# Patient Record
Sex: Male | Born: 1970 | Race: White | Hispanic: No | Marital: Married | Smoking: Former smoker
Health system: Southern US, Community
[De-identification: ages and names within clinical notes are randomized; demographics above are authoritative.]

## PROBLEM LIST (undated history)

## (undated) DIAGNOSIS — K7290 Hepatic failure, unspecified without coma: Secondary | ICD-10-CM

## (undated) DIAGNOSIS — J45909 Unspecified asthma, uncomplicated: Secondary | ICD-10-CM

## (undated) DIAGNOSIS — I10 Essential (primary) hypertension: Secondary | ICD-10-CM

## (undated) HISTORY — DX: Hepatic failure, unspecified without coma: K72.90

## (undated) HISTORY — PX: COLON RESECTION: SHX5231

## (undated) HISTORY — PX: HERNIA REPAIR: SHX51

---

## 2008-09-23 ENCOUNTER — Ambulatory Visit: Payer: Self-pay | Admitting: Family Medicine

## 2008-09-23 DIAGNOSIS — S139XXA Sprain of joints and ligaments of unspecified parts of neck, initial encounter: Secondary | ICD-10-CM

## 2008-09-23 DIAGNOSIS — S20219A Contusion of unspecified front wall of thorax, initial encounter: Secondary | ICD-10-CM

## 2008-09-23 DIAGNOSIS — I10 Essential (primary) hypertension: Secondary | ICD-10-CM | POA: Insufficient documentation

## 2008-09-25 ENCOUNTER — Telehealth (INDEPENDENT_AMBULATORY_CARE_PROVIDER_SITE_OTHER): Payer: Self-pay | Admitting: *Deleted

## 2008-09-26 ENCOUNTER — Telehealth (INDEPENDENT_AMBULATORY_CARE_PROVIDER_SITE_OTHER): Payer: Self-pay | Admitting: *Deleted

## 2008-09-28 ENCOUNTER — Telehealth (INDEPENDENT_AMBULATORY_CARE_PROVIDER_SITE_OTHER): Payer: Self-pay | Admitting: *Deleted

## 2008-09-28 ENCOUNTER — Encounter (INDEPENDENT_AMBULATORY_CARE_PROVIDER_SITE_OTHER): Payer: Self-pay | Admitting: *Deleted

## 2008-10-05 ENCOUNTER — Telehealth (INDEPENDENT_AMBULATORY_CARE_PROVIDER_SITE_OTHER): Payer: Self-pay | Admitting: *Deleted

## 2009-07-04 ENCOUNTER — Emergency Department (HOSPITAL_BASED_OUTPATIENT_CLINIC_OR_DEPARTMENT_OTHER): Admission: EM | Admit: 2009-07-04 | Discharge: 2009-07-05 | Payer: Self-pay | Admitting: Emergency Medicine

## 2009-12-30 ENCOUNTER — Emergency Department (HOSPITAL_COMMUNITY): Admission: EM | Admit: 2009-12-30 | Discharge: 2009-12-31 | Payer: Self-pay | Admitting: Emergency Medicine

## 2010-01-03 ENCOUNTER — Emergency Department (HOSPITAL_COMMUNITY): Admission: EM | Admit: 2010-01-03 | Discharge: 2010-01-03 | Payer: Self-pay | Admitting: Emergency Medicine

## 2010-01-08 ENCOUNTER — Encounter: Payer: Self-pay | Admitting: Family Medicine

## 2010-05-15 NOTE — Letter (Signed)
Summary: [facility] [facility] [employer]  IN[facility] [facility] [employer]   Imported By: Dannette Barbara 01/08/2010 15:10:53  _____________________________________________________________________  External Attachment:    Type:   Image     Comment:   External Document

## 2010-06-28 LAB — URINALYSIS, ROUTINE W REFLEX MICROSCOPIC
Hgb urine dipstick: NEGATIVE
Ketones, ur: 15 mg/dL — AB
Nitrite: NEGATIVE
Protein, ur: NEGATIVE mg/dL
Specific Gravity, Urine: 1.013 (ref 1.005–1.030)
Urobilinogen, UA: 1 mg/dL (ref 0.0–1.0)

## 2010-06-28 LAB — POCT I-STAT, CHEM 8
BUN: 7 mg/dL (ref 6–23)
Creatinine, Ser: 1 mg/dL (ref 0.4–1.5)
Glucose, Bld: 98 mg/dL (ref 70–99)
Sodium: 137 mEq/L (ref 135–145)
TCO2: 34 mmol/L (ref 0–100)

## 2010-08-29 ENCOUNTER — Inpatient Hospital Stay (INDEPENDENT_AMBULATORY_CARE_PROVIDER_SITE_OTHER)
Admission: RE | Admit: 2010-08-29 | Discharge: 2010-08-29 | Disposition: A | Discharge status: Home / Self Care | Payer: 59 | Source: Ambulatory Visit | Attending: Emergency Medicine | Admitting: Emergency Medicine

## 2010-08-29 ENCOUNTER — Ambulatory Visit
Admission: RE | Admit: 2010-08-29 | Discharge: 2010-08-29 | Disposition: A | Discharge status: Home / Self Care | Payer: 59 | Source: Ambulatory Visit | Attending: Emergency Medicine | Admitting: Emergency Medicine

## 2010-08-29 ENCOUNTER — Encounter: Payer: Self-pay | Admitting: Emergency Medicine

## 2010-08-29 ENCOUNTER — Other Ambulatory Visit: Payer: Self-pay | Admitting: Emergency Medicine

## 2010-08-29 DIAGNOSIS — IMO0002 Reserved for concepts with insufficient information to code with codable children: Secondary | ICD-10-CM | POA: Insufficient documentation

## 2010-08-29 DIAGNOSIS — R0602 Shortness of breath: Secondary | ICD-10-CM

## 2010-08-29 DIAGNOSIS — R079 Chest pain, unspecified: Secondary | ICD-10-CM

## 2011-03-18 NOTE — Progress Notes (Signed)
Summary: BACK PAIN, CUTS, RIGHT EYE INJURY/TJ Room 4   Vital Signs:  Patient Profile:   40 Years Old Male CC:      Rt lung pain and congestion, rt eye pain, back,  rt arm,  from fall on Sunday Height:     70 inches Weight:      160 pounds O2 Sat:      93 % O2 treatment:    Room Air Temp:     99.5 degrees F oral Pulse rate:   117 / minute Pulse rhythm:   regular Resp:     16  per minute BP sitting:   133 / 91  (left arm) Cuff size:   regular  Vitals Entered By: Emilio Math (Aug 29, 2010 3:36 PM)                  Current Allergies (reviewed today): ! BIAXINHistory of Present Illness History from: patient Chief Complaint: Rt lung pain and congestion, rt eye pain, back,  rt arm,  from fall on Sunday History of Present Illness: Four days ago, he was in the pool and fell and hit his R eye.  It was bleeding but has begun to heal up.  Then he was moving into a new apartment and fell again, cutting his back.  He states that both cases were secondary to alcohol.  Since then he has had R sided chest pain, worse with coughing and deep breaths.  His PCP advised that he go to the ER, but instead he came here.  Over the past month he has had a chronic cough and a tickle in his throat.  No F/C/N/V.  He is a smoker.  Current Meds LEXAPRO 20 MG TABS (ESCITALOPRAM OXALATE) 1 qd AVALIDE 300-12.5 MG TABS (IRBESARTAN-HYDROCHLOROTHIAZIDE) 1 qd ACCUNEB 0.63 MG/3ML NEBU (ALBUTEROL SULFATE) prn IBUPROFEN 800 MG TABS (IBUPROFEN) One by mouth every 8 hours with food NEURONTIN 400 MG CAPS (GABAPENTIN)  POTASSIUM 99 MG TABS (POTASSIUM) 200 mg SYMBICORT 80-4.5 MCG/ACT AERO (BUDESONIDE-FORMOTEROL FUMARATE)  LEVAQUIN 750 MG TABS (LEVOFLOXACIN) 1 daily for a week TRAMADOL HCL 50 MG TABS (TRAMADOL HCL) 1 by mouth q6 hrs as needed for pain NUCYNTA 50 MG TABS (TAPENTADOL HCL) 1 by mouth q8 hrs as needed for pain  REVIEW OF SYSTEMS Constitutional Symptoms       Complains of night sweats.     Denies fever,  chills, weight loss, weight gain, and fatigue.  Eyes       Complains of eye pain and eye surgery.      Denies change in vision, eye discharge, glasses, and contact lenses. Ear/Nose/Throat/Mouth       Denies hearing loss/aids, change in hearing, ear pain, ear discharge, dizziness, frequent runny nose, frequent nose bleeds, sinus problems, sore throat, hoarseness, and tooth pain or bleeding.  Respiratory       Complains of dry cough and shortness of breath.      Denies productive cough, wheezing, asthma, bronchitis, and emphysema/COPD.  Cardiovascular       Complains of chest pain.      Denies murmurs and tires easily with exhertion.    Gastrointestinal       Denies stomach pain, nausea/vomiting, diarrhea, constipation, blood in bowel movements, and indigestion. Genitourniary       Denies painful urination, kidney stones, and loss of urinary control. Neurological       Denies paralysis, seizures, and fainting/blackouts. Musculoskeletal       Complains of muscle pain, joint pain, joint  stiffness, decreased range of motion, redness, and swelling.      Denies muscle weakness and gout.  Skin       Complains of bruising.      Denies unusual mles/lumps or sores and hair/skin or nail changes.  Psych       Denies mood changes, temper/anger issues, anxiety/stress, speech problems, depression, and sleep problems.  Past History:  Past Medical History: Reviewed history from 09/23/2008 and no changes required. Hypertension  Past Surgical History: Reviewed history from 09/23/2008 and no changes required. Colon re section Inguinal herniorrhaphy  Family History: Reviewed history from 09/23/2008 and no changes required. 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 Physical Exam General appearance: well developed, well nourished, no acute distress Head: Healing laceration above R eye, no signs of  infection, no wound dehiscence Pupils: PERRLA EOMI Ears: normal, no lesions or deformities Nasal: mucosa pink, nonedematous, no septal deviation, turbinates normal Oral/Pharynx: tongue normal, posterior pharynx without erythema or exudate Chest/Lungs: scattered rhonchi worse R side, he is TTP anterior R chest approx 4th rib Heart: regular rate and  rhythm, no murmur Back: R lower back with 8cm healing laceration, no sign of infection Skin: R arm contusion MSE: oriented to time, place, and person. Assessment New Problems: OPEN WOUND OTH&UNSPEC PART TRNK W/O MENTION COMP (ICD-879.6) DYSPNEA (ICD-786.05) CHEST PAIN, RIGHT (ICD-786.50)   Patient Education: Patient and/or caregiver instructed in the following: rest, quit smoking.  Plan New Medications/Changes: NUCYNTA 50 MG TABS (TAPENTADOL HCL) 1 by mouth q8 hrs as needed for pain  #12 x 0, 08/29/2010, Hoyt Koch MD TRAMADOL HCL 50 MG TABS (TRAMADOL HCL) 1 by mouth q6 hrs as needed for pain  #16 x 0, 08/29/2010, Hoyt Koch MD LEVAQUIN 750 MG TABS (LEVOFLOXACIN) 1 daily for a week  #7 x 0, 08/29/2010, Hoyt Koch MD  New Orders: Est. 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  He is also known to be a heavy drinker and this is how he was injured.  I have advised that he should get help for that, first by speaking with his PCP.  His lacerations are healing well and there is no reason to do anything more for them at this time.  I am most concerned with his lungs (broken rib, pneumonia, pulmonary contusion).  An Xray was ordered of his chest and ribs and was read by radiology as normal.  Likely diagnosis is chest wall contusion.  However, I am going to put him on antibiotics anyway for his chronic cough and encourage him to f/u with  his PCP in a few days.  He cannot take Macrolides. He did ask for pain meds so will place on Tramadol and give Nucynta for breakthrough pain.  I don't want him on any other narcotics due to his history.  If he needs additional meds, should contact his PCP.   ER precautions given if worsening.   The patient and/or caregiver has been counseled thoroughly with regard to medications prescribed including dosage, schedule, interactions, rationale for use, and possible side effects and they verbalize understanding.  Diagnoses and expected course of recovery discussed and will return if not improved as expected or if the condition worsens. Patient and/or caregiver verbalized understanding.  Prescriptions: NUCYNTA 50  MG TABS (TAPENTADOL HCL) 1 by mouth q8 hrs as needed for pain  #12 x 0   Entered and Authorized by:   Hoyt Koch MD   Signed by:   Hoyt Koch MD on 08/29/2010   Method used:   Print then Give to Patient   RxID:   [id number] TRAMADOL HCL 50 MG TABS (TRAMADOL HCL) 1 by mouth q6 hrs as needed for pain  #16 x 0   Entered and Authorized by:   Hoyt Koch MD   Signed by:   Hoyt Koch MD on 08/29/2010   Method used:   Print then Give to Patient   RxID:   [id number] LEVAQUIN 750 MG TABS (LEVOFLOXACIN) 1 daily for a week  #7 x 0   Entered and Authorized by:   Hoyt Koch MD   Signed by:   Hoyt Koch MD on 08/29/2010   Method used:   Print then Give to Patient   RxID:   [id number]   Orders Added: 1)  Est. Patient Level IV [01027] 2)  T-Chest x-ray, 2 views [71020] 3)  T-DG Ribs Unilateral w/Chest*R* [71101] 4)  Pulse Oximetry (single measurment) [25366]

## 2012-08-01 IMAGING — CR DG CHEST 2V
2 series · 2 of 2 positions shown · non-contrast
Comparison: None

CLINICAL DATA: Fell.  Right-sided pain.

CHEST - 2 VIEW

[view not recorded (1 of 2)]
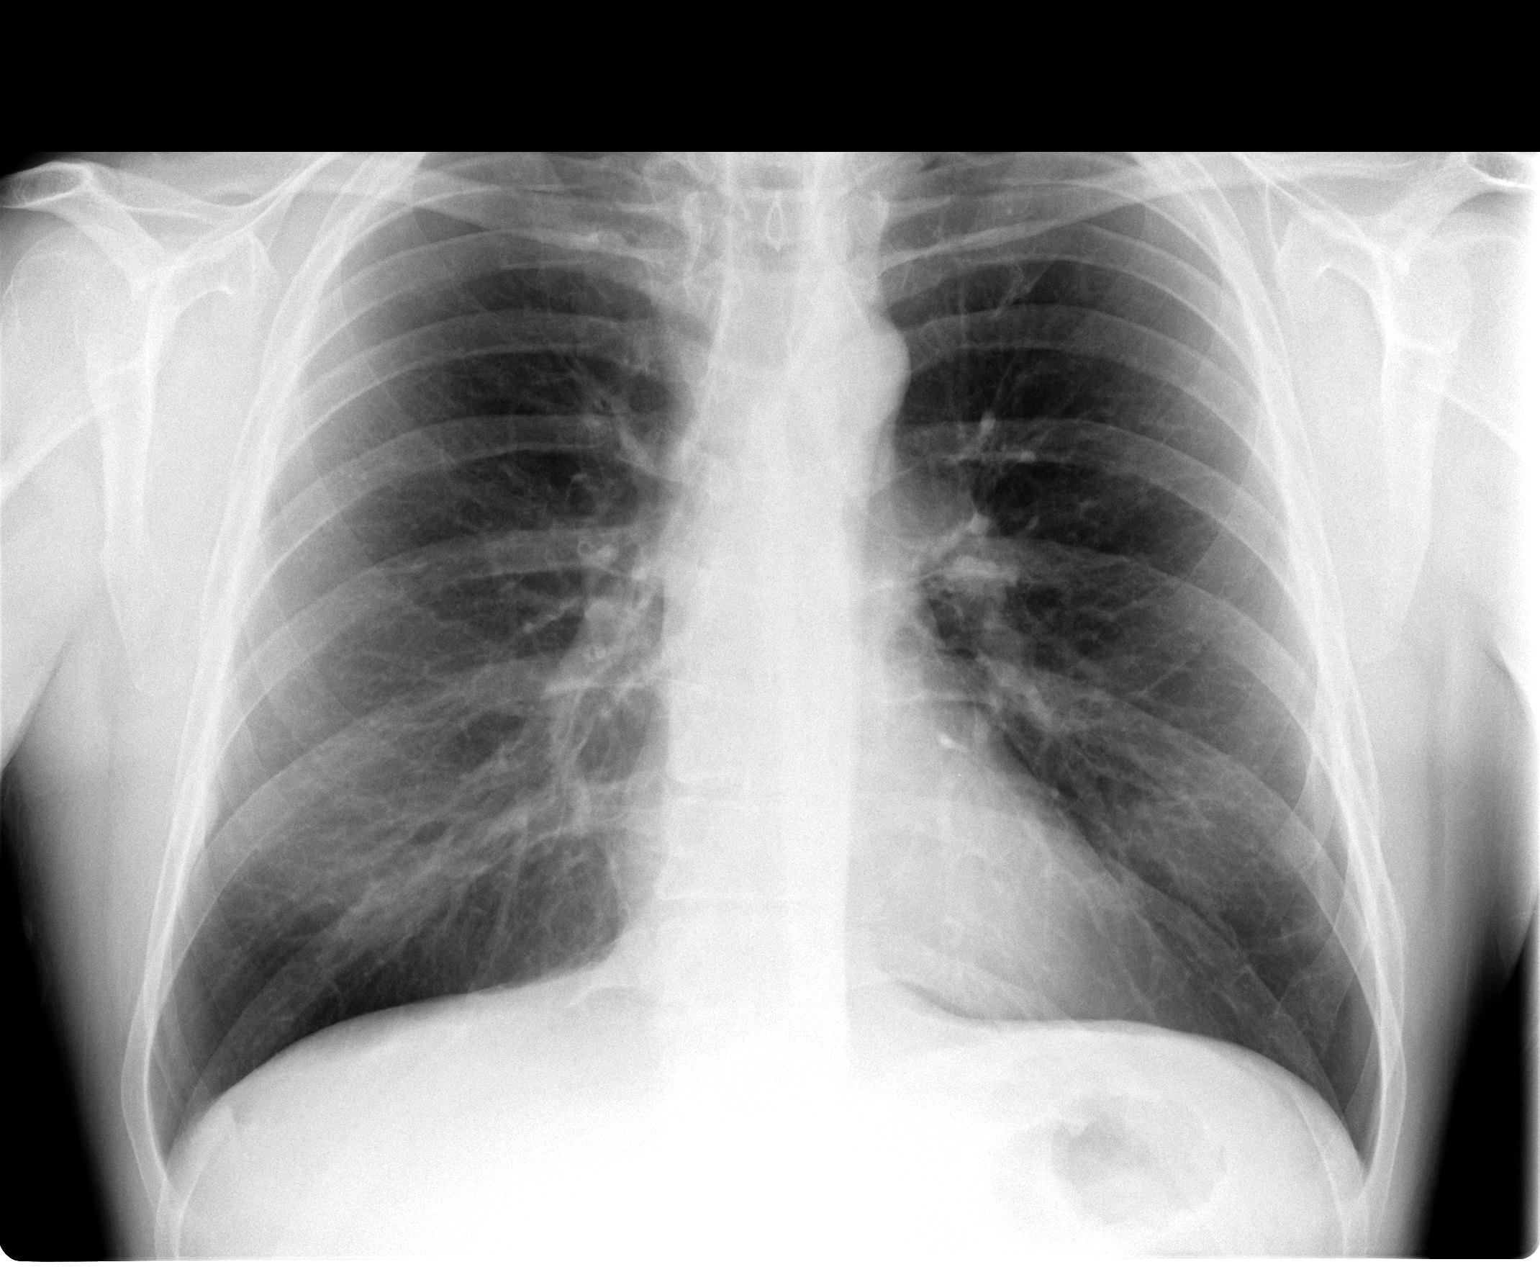

[view not recorded (2 of 2)]
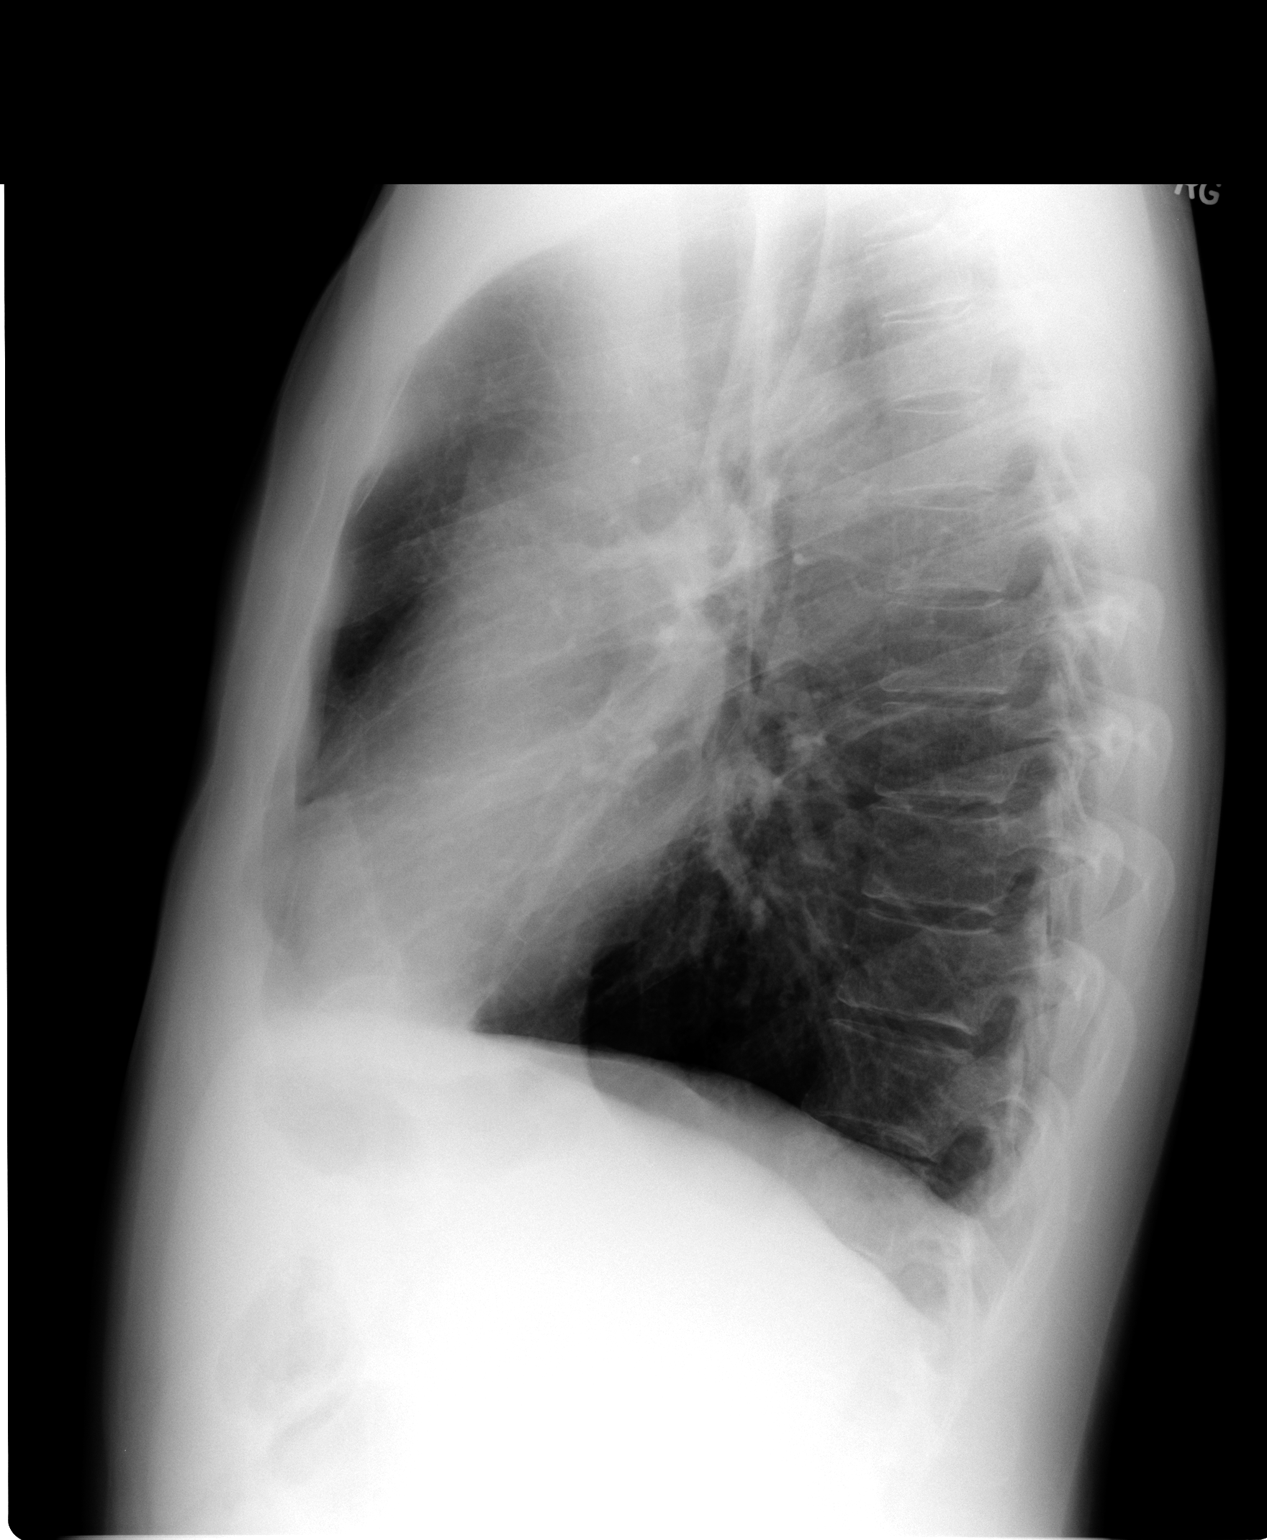

[2 of 2 positions shown; findings below may reference images not displayed]

FINDINGS: Heart size is normal.  Mediastinal shadows are normal.
The lungs are clear.  No effusions.  No acute bony finding.
IMPRESSION: Normal chest radiography.

## 2012-08-01 IMAGING — CR DG RIBS 2V*R*
2 series · 2 of 2 positions shown · non-contrast
Comparison: Same day

CLINICAL DATA: Sided pain.

RIGHT RIBS - 2 VIEW

[view not recorded (1 of 2)]
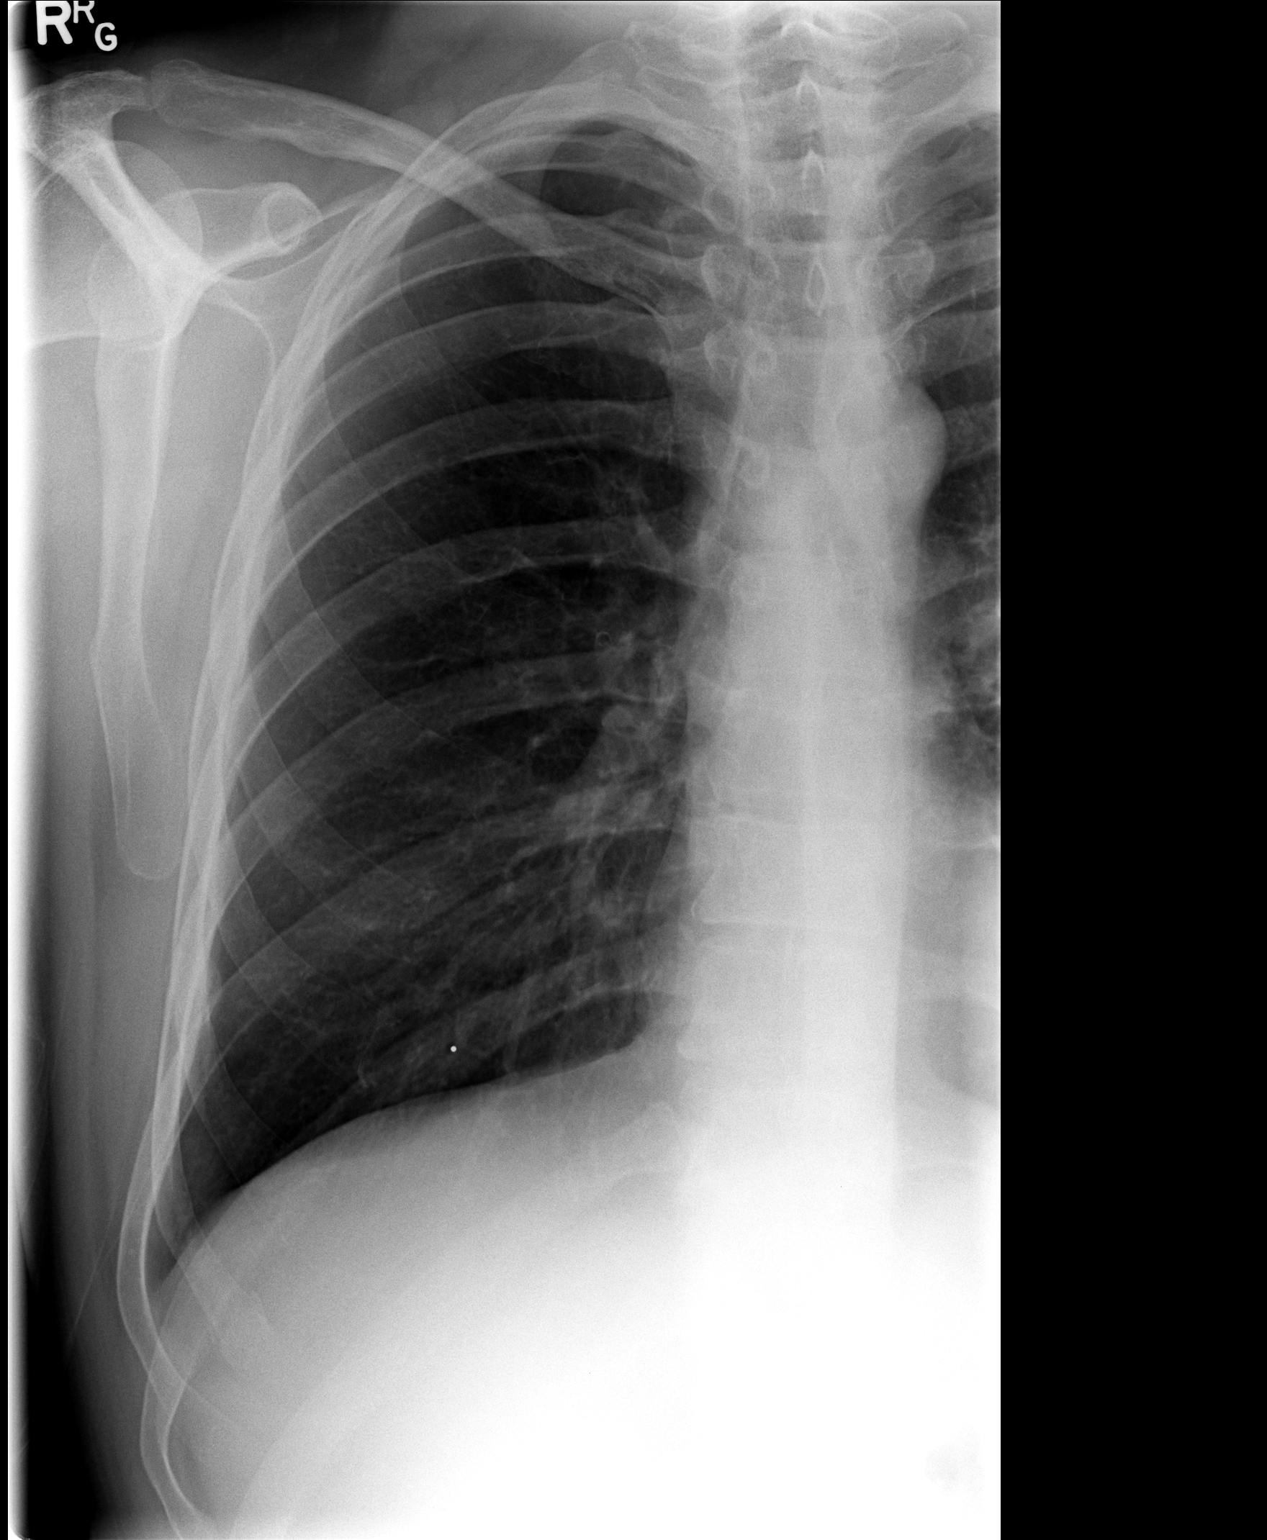

[view not recorded (2 of 2)]
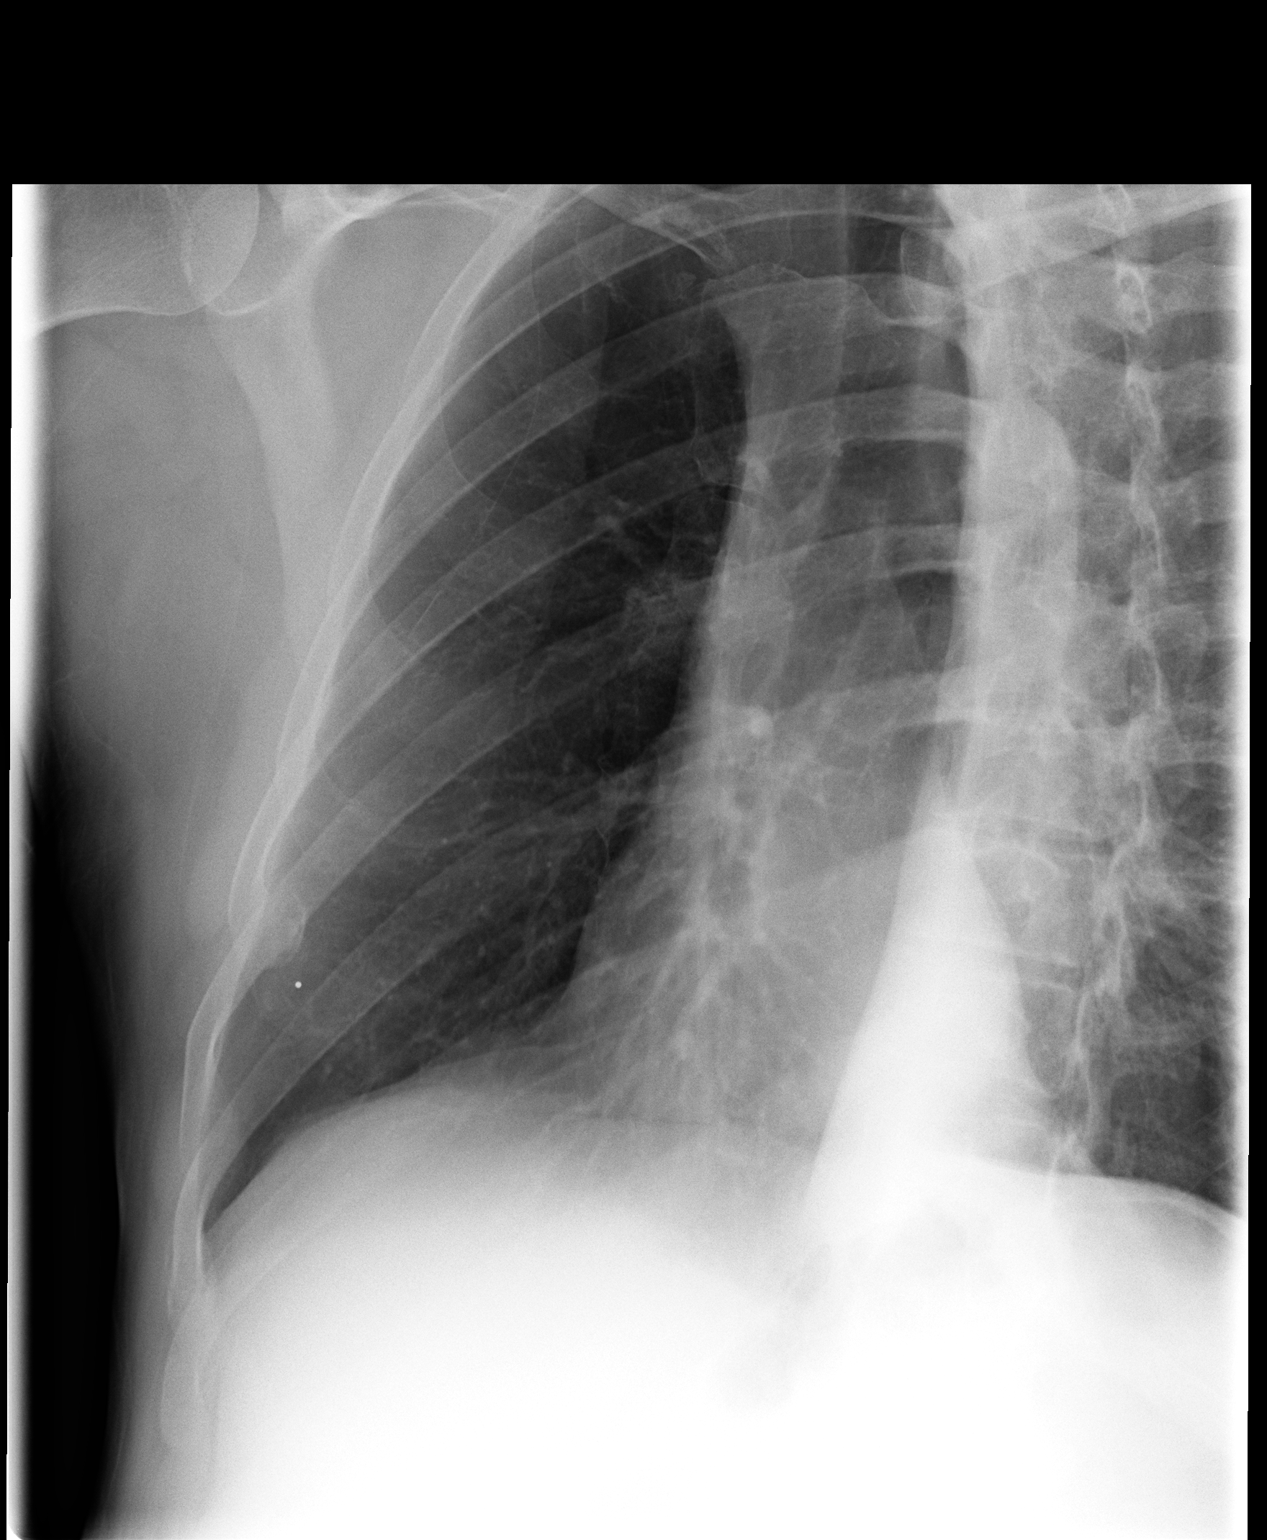

[2 of 2 positions shown; findings below may reference images not displayed]

FINDINGS: Skin marker is in place in the region of the anterior
sixth rib.  No bony abnormalities seen in that area.  There is an
old healed fracture of the lateral eighth rib.
IMPRESSION: No abnormalities seen in the region of the marker which correlates
with the anterior sixth rib.

Old healed fracture of the lateral eighth rib.

## 2013-06-22 ENCOUNTER — Emergency Department
Admission: EM | Admit: 2013-06-22 | Discharge: 2013-06-22 | Disposition: A | Discharge status: Home / Self Care | Payer: Self-pay | Source: Home / Self Care | Attending: Emergency Medicine | Admitting: Emergency Medicine

## 2013-06-22 ENCOUNTER — Encounter: Payer: Self-pay | Admitting: Emergency Medicine

## 2013-06-22 DIAGNOSIS — R079 Chest pain, unspecified: Secondary | ICD-10-CM

## 2013-06-22 DIAGNOSIS — R0602 Shortness of breath: Secondary | ICD-10-CM

## 2013-06-22 DIAGNOSIS — J45901 Unspecified asthma with (acute) exacerbation: Secondary | ICD-10-CM

## 2013-06-22 DIAGNOSIS — J209 Acute bronchitis, unspecified: Secondary | ICD-10-CM

## 2013-06-22 HISTORY — DX: Essential (primary) hypertension: I10

## 2013-06-22 HISTORY — DX: Unspecified asthma, uncomplicated: J45.909

## 2013-06-22 MED ORDER — LEVOFLOXACIN 500 MG PO TABS: ORAL_TABLET | ORAL | Status: DC

## 2013-06-22 MED ORDER — CEFTRIAXONE SODIUM 1 G IJ SOLR
1.0000 g | Freq: Once | INTRAMUSCULAR | Status: AC
  Administered 2013-06-22: 1 g via INTRAMUSCULAR

## 2013-06-22 MED ORDER — PREDNISONE (PAK) 10 MG PO TABS: ORAL_TABLET | ORAL | Status: DC

## 2013-06-22 MED ORDER — IPRATROPIUM-ALBUTEROL 0.5-2.5 (3) MG/3ML IN SOLN
3.0000 mL | Freq: Once | RESPIRATORY_TRACT | Status: AC
  Administered 2013-06-22: 3 mL via RESPIRATORY_TRACT

## 2013-06-22 MED ORDER — METHYLPREDNISOLONE ACETATE 80 MG/ML IJ SUSP
80.0000 mg | Freq: Once | INTRAMUSCULAR | Status: AC
  Administered 2013-06-22: 80 mg via INTRAMUSCULAR

## 2013-06-22 MED ORDER — IPRATROPIUM-ALBUTEROL 0.5-2.5 (3) MG/3ML IN SOLN
3.0000 mL | RESPIRATORY_TRACT | Status: DC
  Administered 2013-06-22: 3 mL via RESPIRATORY_TRACT

## 2013-06-22 NOTE — ED Provider Notes (Signed)
CSN: [id number]     Arrival date & time 06/22/13  1605 History   First MD Initiated Contact with Patient 06/22/13 1631     Chief Complaint  Patient presents with  . Cough  . Fatigue    HPI URI HISTORY  Dillon Gillespie is a 43 y.o. male who complains of onset of cough and cold symptoms, and flareup of his chronic asthma for 4 days, with associated mild fever, chills/sweats. He has been using his albuterol home nebulizer once daily, and that has helped wheezing somewhat. His baseline asthma is controlled when he does not have URI, he states.  +  Fever  +  Nasal congestion +  Discolored Post-nasal drainage No sinus pain/pressure No sore throat  +  Cough, productive of yellow sputum + wheezing + chest congestion No hemoptysis No shortness of breath No pleuritic pain  No itchy/red eyes No earache  No nausea No vomiting No abdominal pain No diarrhea  No skin rashes +  Fatigue. No focal weakness or neurologic symptoms. No syncope No myalgias No headache   Past Medical History  Diagnosis Date  . Asthma   . Hypertension    Past Surgical History  Procedure Laterality Date  . Hernia repair    . Colon resection     Family History  Problem Relation Age of Onset  . Cancer Father     prostate CA   History  Substance Use Topics  . Smoking status: Current Every Day Smoker -- 0.50 packs/day  . Smokeless tobacco: Current User     Comment: vapes  . Alcohol Use: Yes    Review of Systems  All other systems reviewed and are negative.    Allergies  Clarithromycin  Home Medications   Current Outpatient Rx  Name  Route  Sig  Dispense  Refill  . albuterol (PROVENTIL HFA;VENTOLIN HFA) 108 (90 BASE) MCG/ACT inhaler   Inhalation   Inhale into the lungs every 6 (six) hours as needed for wheezing or shortness of breath.         . levofloxacin (LEVAQUIN) 500 MG tablet      Take 1 tablet daily X 10 days.   10 tablet   0   . predniSONE (STERAPRED UNI-PAK) 10 MG tablet       Take as directed for 6 days.--Take 6 on day 1, 5 on day 2, 4 on day 3, then 3 tablets on day 4, then 2 tablets on day 5, then 1 on day 6.   21 tablet   0    BP 144/97  Pulse 108  Temp(Src) 98.1 F (36.7 C) (Oral)  Resp 18  Ht 5\' 10"  (1.778 m)  Wt 150 lb (68.04 kg)  BMI 21.52 kg/m2  SpO2 100% Physical Exam  Nursing note and vitals reviewed. Constitutional: He is oriented to person, place, and time. He appears well-developed and well-nourished. No distress.  HENT:  Head: Normocephalic and atraumatic.  Right Ear: Tympanic membrane, external ear and ear canal normal.  Left Ear: Tympanic membrane, external ear and ear canal normal.  Nose: Mucosal edema and rhinorrhea present. Right sinus exhibits maxillary sinus tenderness. Left sinus exhibits maxillary sinus tenderness.  Mouth/Throat: Oropharynx is clear and moist. No oral lesions. No oropharyngeal exudate.  Eyes: Right eye exhibits no discharge. Left eye exhibits no discharge. No scleral icterus.  Neck: Neck supple.  Cardiovascular: Normal rate, regular rhythm and normal heart sounds.   Pulmonary/Chest: Effort normal. He has wheezes. He has rhonchi. He has no rales.  Lymphadenopathy:    He has no cervical adenopathy.  Neurological: He is alert and oriented to person, place, and time.  Skin: Skin is warm and dry.    ED Course  Procedures (including critical care time) Labs Review Labs Reviewed - No data to display Imaging Review No results found.   MDM   1. Acute bronchitis with bronchospasm   2. DYSPNEA   3. CHEST PAIN, RIGHT   4. Asthma with acute exacerbation    DuoNeb nebulizer treatment given. Wheezing improved. Lungs rechecked, with much improved expansion and aeration. Wheezing significantly improved. Still has mild rhonchi diffusely. No rales. I offered and advised chest x-ray, but he politely refused. Treatment options discussed, as well as risks, benefits, alternatives. Patient voiced understanding and  agreement with the following plans: Rocephin 1 g IM stat Depo-Medrol 80 mg IM stat Levaquin 500 mg daily x10 days Prednisone 10 mg-6 day Dosepak (He states that the above regimen has worked well in the past) Advised to continue home albuterol nebulizer treatments every 4-6 hours when necessary acute wheezing, but I reinforced that this is not for chronic, ongoing daily asthma treatment. Follow up with your primary care physician or specialist within one week, sooner if not improving, having worsening of symptoms, or new severe symptoms.  Advised to quit smoking and explained risks of not doing so. He drinks alcohol daily, and I advised to stop or at least decrease alcohol consumption, and instead drink plenty of fluids to help prevent dehydration. Other symptomatic care discussed Precautions discussed. Red flags discussed.--Go to emergency room if any red flags Questions invited and answered. Patient voiced understanding and agreement.       Lajean Manesavid Massey, MD 06/22/13 [phone]

## 2013-06-22 NOTE — ED Notes (Signed)
Pt c/o productive cough, fatigue, and dizziness x 4 days. Denies fever.

## 2017-05-13 ENCOUNTER — Encounter: Payer: Self-pay | Admitting: Pulmonary Disease

## 2017-05-13 ENCOUNTER — Ambulatory Visit (INDEPENDENT_AMBULATORY_CARE_PROVIDER_SITE_OTHER): Payer: No Typology Code available for payment source | Admitting: Pulmonary Disease

## 2017-05-13 ENCOUNTER — Institutional Professional Consult (permissible substitution): Payer: No Typology Code available for payment source | Admitting: Pulmonary Disease

## 2017-05-13 VITALS — BP 108/68 | HR 114 | Ht 70.0 in | Wt 140.8 lb

## 2017-05-13 DIAGNOSIS — J449 Chronic obstructive pulmonary disease, unspecified: Secondary | ICD-10-CM | POA: Diagnosis not present

## 2017-05-13 NOTE — Patient Instructions (Signed)
We will schedule you for lung function test for further evaluation of your COPD Continue the Symbicort for now Return to clinic in 2-4 weeks after PFTs for review.

## 2017-05-13 NOTE — Progress Notes (Signed)
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  [facility] over the past few months with recurrent ascites, liver failure, weakness and failure to thrive.  He had a fall with head laceration on Christmas Eve of 2018.  He attributes the fall to weakness.  Denies any loss of consciousness. He is being evaluated for liver transplant and has been referred to pulmonary for evaluation of COPD.  Mr. Lula OlszewskiConnolly denies any dyspnea at baseline except for his worsening symptoms when the ascites builds up..  Otherwise he is asymptomatic.  Denies any cough, sputum production, wheezing, chills. He is currently maintained on Symbicort 160/4.5 for the past several years cannot tell if this is helping.  He uses albuterol rescue inhaler occasionally  Pets: None Occupation: Was in the [employer].  Worked in Manufacturing systems engineerpharmaceutical sales.  Currently not employed Exposures: No known exposure to asbestos.  No mold, hot tub Smoking history: 30-pack-year smoking history.  Quit in November 2018 Travel History: Originally from [place].  [place]ived in [place], [place]  Outpatient Encounter Medications as of 05/13/2017  Medication Sig  . albuterol (PROVENTIL HFA;VENTOLIN HFA) 108 (90 BASE) MCG/ACT inhaler Inhale into the lungs every 6 (six) hours as needed for wheezing or shortness of breath.  . budesonide-formoterol (SYMBICORT) 160-4.5 MCG/ACT inhaler Inhale into the lungs.  . folic acid (FOLVITE) 1 MG tablet Take by mouth.  . furosemide (LASIX) 40 MG tablet     . lactulose (CHRONULAC) 10 GM/15ML solution   . Magnesium Oxide 420 MG TABS Take by mouth.  . Multiple Vitamin (MULTIVITAMIN) capsule Take by mouth.  . ondansetron (ZOFRAN) 8 MG tablet Take by mouth.  . ranitidine (ZANTAC) 150 MG capsule Take by mouth.  . [DISCONTINUED] levofloxacin (LEVAQUIN) 500 MG tablet Take 1 tablet daily X 10 days.  . [DISCONTINUED] predniSONE (STERAPRED UNI-PAK) 10 MG tablet Take as directed for 6 days.--Take 6 on day 1, 5 on day 2, 4 on day 3, then 3 tablets on day 4, then 2 tablets on day 5, then 1 on day 6.  . [DISCONTINUED] ALBUTEROL IN Inhale into the lungs.  . [DISCONTINUED] Lactobacillus Acidophilus POWD Take by mouth.   No facility-administered encounter medications on file as of 05/13/2017.     Allergies as of 05/13/2017 - Review Complete 06/22/2013  Allergen Reaction Noted  . Clarithromycin      Past Medical History:  Diagnosis Date  . Asthma   . Hypertension   . Liver failure [facility](HCC)     Past Surgical History:  Procedure Laterality Date  . COLON RESECTION    . HERNIA REPAIR      Family History  Problem Relation Age of Onset  . Cancer Father        prostate CA  . Sarcoidosis Mother     Social History   Socioeconomic History  . Marital status: Married    Spouse name: Not on file  . Number of children: Not on file  . Years of education: Not on file  . Highest education level: Not on file  Social Needs  .  Financial resource strain: Not on file  . Food insecurity - worry: Not on file  . Food insecurity - inability: Not on file  . Transportation needs - medical: Not on file  . Transportation needs - non-medical: Not on file  Occupational History  . Not on file  Tobacco Use  . Smoking status: Former Smoker    Packs/day: 0.50    Years: 20.00    Pack years: 10.00  . Smokeless tobacco: Former Neurosurgeon    Types: Chew  . Tobacco comment: quit smoking 03/2017  Substance and Sexual Activity  . Alcohol use: No    Frequency: Never  . Drug  use: No  . Sexual activity: Not on file  Other Topics Concern  . Not on file  Social History Narrative  . Not on file   Review of systems: Review of Systems  Constitutional: Negative for fever and chills.  HENT: Negative.   Eyes: Negative for blurred vision.  Respiratory: as per HPI  Cardiovascular: Negative for chest pain and palpitations.  Gastrointestinal: Negative for vomiting, diarrhea, blood per rectum. Genitourinary: Negative for dysuria, urgency, frequency and hematuria.  Musculoskeletal: Negative for myalgias, back pain and joint pain.  Skin: Negative for itching and rash.  Neurological: Negative for dizziness, tremors, focal weakness, seizures and loss of consciousness.  Endo/Heme/Allergies: Negative for environmental allergies.  Psychiatric/Behavioral: Negative for depression, suicidal ideas and hallucinations.  All other systems reviewed and are negative.  Physical Exam: Blood pressure 108/68, pulse (!) 114, height 5\' 10"  (1.778 m), weight 140 lb 12.8 oz (63.9 kg), SpO2 98 %. Gen:      No acute distress, frail.  Appears older than stated age HEENT:  EOMI, sclera, jaundiced Neck:     No masses; no thyromegaly Lungs:    Clear to auscultation bilaterally; normal respiratory effort CV:         Regular rate and rhythm; no murmurs Abd:      Abdominal distention with fluid.  No tenderness, guarding, rigidity Ext:    No edema; adequate peripheral perfusion Skin:      Warm and dry; no rash Neuro: alert and oriented x 3 Psych: normal mood and affect  Data Reviewed:  Imaging reports from the [facility] 03/21/17 hyperinflation, clear lungs without acute findings. CT abdomen pelvis 03/13/17- cirrhotic liver, changes consistent with portal hypertension.  Increase in amount of ascites. Lung images show centrilobular emphysema.  No large effusion or consolidation.  No suspicious pulmonary nodule.  Assessment/Plan  Assessment for COPD Lung imaging shows emphysema.  We will  get pulmonary function test for evaluation of COPD He will continue on the Symbicort and albuterol for now Return to clinic after pulmonary function test for review and further steps.  Plan/Recommendations: - PFTs - Continue Symbicort, albuterol as needed  Chilton Greathouse MD [facility] Pager [phone] 05/13/2017, 4:21 PM  CC: [facility]

## 2017-06-05 ENCOUNTER — Telehealth: Payer: Self-pay | Admitting: Pulmonary Disease

## 2017-06-05 NOTE — Telephone Encounter (Signed)
[facility]  Nothing further needed.

## 2017-06-11 ENCOUNTER — Ambulatory Visit: Payer: No Typology Code available for payment source | Admitting: Pulmonary Disease

## 2017-07-23 ENCOUNTER — Encounter: Payer: Self-pay | Admitting: Pulmonary Disease

## 2017-07-23 ENCOUNTER — Ambulatory Visit (INDEPENDENT_AMBULATORY_CARE_PROVIDER_SITE_OTHER): Payer: Non-veteran care | Admitting: Pulmonary Disease

## 2017-07-23 DIAGNOSIS — J449 Chronic obstructive pulmonary disease, unspecified: Secondary | ICD-10-CM

## 2017-07-23 LAB — PULMONARY FUNCTION TEST
DL/VA % pred: 53 %
DL/VA: 2.47 ml/min/mmHg/L
DLCO unc % pred: 48 %
DLCO unc: 15.8 ml/min/mmHg
FEF 25-75 Post: 0.91 L/sec
FEF 25-75 Pre: 0.75 L/sec
FEF2575-%CHANGE-POST: 20 %
FEF2575-%PRED-POST: 25 %
FEF2575-%PRED-PRE: 21 %
FEV1-%Change-Post: 7 %
FEV1-%PRED-POST: 45 %
FEV1-%Pred-Pre: 42 %
FEV1-PRE: 1.68 L
FEV1-Post: 1.81 L
FEV1FVC-%CHANGE-POST: 1 %
FEV1FVC-%Pred-Pre: 55 %
FEV6-%Change-Post: 4 %
FEV6-%Pred-Post: 77 %
FEV6-%Pred-Pre: 74 %
FEV6-PRE: 3.66 L
FEV6-Post: 3.83 L
FEV6FVC-%Change-Post: 0 %
FEV6FVC-%PRED-PRE: 97 %
FEV6FVC-%Pred-Post: 96 %
FVC-%CHANGE-POST: 5 %
FVC-%PRED-POST: 80 %
FVC-%PRED-PRE: 75 %
FVC-PRE: 3.87 L
FVC-Post: 4.08 L
POST FEV1/FVC RATIO: 44 %
Post FEV6/FVC ratio: 94 %
Pre FEV1/FVC ratio: 43 %
Pre FEV6/FVC Ratio: 95 %
RV % PRED: 183 %
RV: 3.67 L
TLC % pred: 112 %
TLC: 7.81 L

## 2017-07-23 MED ORDER — TIOTROPIUM BROMIDE MONOHYDRATE 2.5 MCG/ACT IN AERS: 2.0000 | INHALATION_SPRAY | Freq: Every day | RESPIRATORY_TRACT | 11 refills | Status: AC

## 2017-07-23 NOTE — Progress Notes (Signed)
PFT done today. 

## 2017-07-23 NOTE — Patient Instructions (Signed)
Your lung function tests show severe COPD Continue the Symbicort, will add Spiriva inhaler We will check your oxygen levels on exertion Follow-up in 3 months.

## 2017-07-23 NOTE — Progress Notes (Signed)
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  He has been in and out of the [place] hospital several times over the past few months with recurrent ascites, liver failure, weakness and failure to thrive.  He had a fall with head laceration on Christmas Eve of 2018.  He attributes the fall to weakness.  Denies any loss of consciousness. He is being evaluated for liver transplant and has been referred to pulmonary for evaluation of COPD.  Dillon Gillespie denies any dyspnea at baseline except for his worsening symptoms when the ascites builds up..  Otherwise he is asymptomatic.  Denies any cough, sputum production, wheezing, chills. He is currently maintained on Symbicort 160/4.5 for the past several years cannot tell if this is helping.  He uses albuterol rescue inhaler occasionally  Pets: None Occupation: Was in the [employer]	[place].  Worked in Manufacturing systems engineer.  Currently not employed Exposures: No known exposure to asbestos.  No mold, hot tub Smoking history: 30-pack-year smoking history.  Quit in November 2018 Travel History: Originally from [place].  Lived in [place], [place], [place]  Outpatient Encounter Medications as of 07/23/2017  Medication Sig  . albuterol (PROVENTIL HFA;VENTOLIN HFA) 108 (90 BASE) MCG/ACT inhaler Inhale into the lungs every 6 (six) hours as needed for wheezing or shortness of breath.  . budesonide-formoterol (SYMBICORT) 160-4.5 MCG/ACT inhaler Inhale into the lungs.  . folic acid (FOLVITE) 1 MG tablet Take by mouth.  . furosemide (LASIX) 40 MG tablet     . lactulose (CHRONULAC) 10 GM/15ML solution   . Magnesium Oxide 420 MG TABS Take by mouth.  . Multiple Vitamin (MULTIVITAMIN) capsule Take by mouth.  . ondansetron (ZOFRAN) 8 MG tablet Take by mouth.  . ranitidine (ZANTAC) 150 MG capsule Take by mouth.   No facility-administered encounter medications on file as of 07/23/2017.     Allergies as of 07/23/2017 - Review Complete 05/13/2017  Allergen Reaction Noted  . Clarithromycin      Past Medical History:  Diagnosis Date  . Asthma   . Hypertension   . Liver failure [facility])     Past Surgical History:  Procedure Laterality Date  . COLON RESECTION    . HERNIA REPAIR      Family History  Problem Relation Age of Onset  . Cancer Father        prostate CA  . Sarcoidosis Mother     Social History   Socioeconomic History  . Marital status: Married    Spouse name: Not on file  . Number of children: Not on file  . Years of education: Not on file  . Highest education level: Not on file  Occupational History  . Not on file  Social Needs  . Financial resource strain: Not on file  . Food insecurity:    Worry: Not on file    Inability: Not on file  . Transportation needs:    Medical: Not on file    Non-medical: Not on file  Tobacco Use  . Smoking status: Former Smoker    Packs/day: 0.50    Years: 20.00    Pack years: 10.00  .  Smokeless tobacco: Former NeurosurgeonUser    Types: Chew  . Tobacco comment: quit smoking 03/2017  Substance and Sexual Activity  . Alcohol use: No    Frequency: Never  . Drug use: No  . Sexual activity: Not on file  Lifestyle  . Physical activity:    Days per week: Not on file    Minutes per session: Not on file  . Stress: Not on file  Relationships  . Social connections:    Talks on phone: Not on file    Gets together: Not on file    Attends religious service: Not on file    Active member of club or organization: Not on file    Attends meetings of clubs or organizations: Not on file     Relationship status: Not on file  . Intimate partner violence:    Fear of current or ex partner: Not on file    Emotionally abused: Not on file    Physically abused: Not on file    Forced sexual activity: Not on file  Other Topics Concern  . Not on file  Social History Narrative  . Not on file   Review of systems: Review of Systems  Constitutional: Negative for fever and chills.  HENT: Negative.   Eyes: Negative for blurred vision.  Respiratory: as per HPI  Cardiovascular: Negative for chest pain and palpitations.  Gastrointestinal: Negative for vomiting, diarrhea, blood per rectum. Genitourinary: Negative for dysuria, urgency, frequency and hematuria.  Musculoskeletal: Negative for myalgias, back pain and joint pain.  Skin: Negative for itching and rash.  Neurological: Negative for dizziness, tremors, focal weakness, seizures and loss of consciousness.  Endo/Heme/Allergies: Negative for environmental allergies.  Psychiatric/Behavioral: Negative for depression, suicidal ideas and hallucinations.  All other systems reviewed and are negative.  Physical Exam: Blood pressure 108/68, pulse (!) 114, height 5\' 10"  (1.778 m), weight 140 lb 12.8 oz (63.9 kg), SpO2 98 %. Gen:      No acute distress, frail.  Appears older than stated age HEENT:  EOMI, sclera, jaundiced Neck:     No masses; no thyromegaly Lungs:    Clear to auscultation bilaterally; normal respiratory effort CV:         Regular rate and rhythm; no murmurs Abd:      Abdominal distention with fluid.  No tenderness, guarding, rigidity Ext:    No edema; adequate peripheral perfusion Skin:      Warm and dry; no rash Neuro: alert and oriented x 3 Psych: normal mood and affect  Data Reviewed: PFTs 07/23/17 FVC 4.08 [80%], FEV1 1.81 [45%], F/F 44, TLC 112, RV/TLC 162%, DLCO 48% Severe obstruction, severe diffusion impairment with air trapping.  [facility] Chest [facility]ray 03/21/17 hyperinflation, clear lungs  without acute findings. CT abdomen pelvis 03/13/17- cirrhotic liver, changes consistent with portal hypertension.  Increase in amount of ascites. Lung images show centrilobular emphysema.  No large effusion or consolidation.  No suspicious pulmonary nodule.  Assessment/Plan  Severe COPD Lung imaging shows emphysema.  PFTs reviewed which confirms severe obstruction with R trapping Continue Symbicort, add Spiriva Oxygen levels remained in the 90s on exertion today.  He is being evaluated for lung transplant.  There are no contraindications from pulmonary perspective for the procedure.  Plan/Recommendations: - Continue Symbicort, add Spiriva albuterol as needed  Chilton GreathousePraveen Marcanthony Sleight MD [facility] Pager [phone] 07/23/2017, 11:12 AM  CC: [facility]

## 2017-08-08 NOTE — Telephone Encounter (Signed)
Error

## 2017-12-14 DEATH — deceased
